# Patient Record
Sex: Female | Born: 1975 | Race: White | Hispanic: Yes | Marital: Married | Smoking: Never smoker
Health system: Southern US, Community
[De-identification: ages and names within clinical notes are randomized; demographics above are authoritative.]

---

## 2008-01-19 ENCOUNTER — Emergency Department (HOSPITAL_COMMUNITY): Admission: EM | Admit: 2008-01-19 | Discharge: 2008-01-19 | Payer: Self-pay | Admitting: Emergency Medicine

## 2008-01-26 ENCOUNTER — Emergency Department (HOSPITAL_COMMUNITY): Admission: EM | Admit: 2008-01-26 | Discharge: 2008-01-26 | Payer: Self-pay | Admitting: Emergency Medicine

## 2011-07-12 LAB — CBC
HCT: 38.3
Hemoglobin: 13.2
MCHC: 34.4
MCV: 89
RDW: 13.1

## 2011-07-12 LAB — COMPREHENSIVE METABOLIC PANEL
Alkaline Phosphatase: 45
BUN: 9
CO2: 22
Calcium: 9.3
GFR calc non Af Amer: >60
Glucose, Bld: 120 — ABNORMAL HIGH
Total Protein: 6.6

## 2011-07-12 LAB — POCT I-STAT, CHEM 8
Calcium, Ion: 1.17
Chloride: 105
HCT: 39
Hemoglobin: 13.3
Potassium: 3.3 — ABNORMAL LOW

## 2011-07-12 LAB — PROTIME-INR
INR: 1.1
Prothrombin Time: 14.7

## 2016-01-01 ENCOUNTER — Encounter (HOSPITAL_COMMUNITY): Payer: Self-pay | Admitting: Emergency Medicine

## 2016-01-01 ENCOUNTER — Emergency Department (HOSPITAL_COMMUNITY): Payer: Self-pay

## 2016-01-01 ENCOUNTER — Emergency Department (HOSPITAL_COMMUNITY)
Admission: EM | Admit: 2016-01-01 | Discharge: 2016-01-01 | Disposition: A | Discharge status: Home / Self Care | Payer: Self-pay | Attending: Emergency Medicine | Admitting: Emergency Medicine

## 2016-01-01 DIAGNOSIS — H9209 Otalgia, unspecified ear: Secondary | ICD-10-CM | POA: Insufficient documentation

## 2016-01-01 DIAGNOSIS — R51 Headache: Secondary | ICD-10-CM | POA: Insufficient documentation

## 2016-01-01 DIAGNOSIS — J320 Chronic maxillary sinusitis: Secondary | ICD-10-CM | POA: Insufficient documentation

## 2016-01-01 DIAGNOSIS — R519 Headache, unspecified: Secondary | ICD-10-CM

## 2016-01-01 DIAGNOSIS — J321 Chronic frontal sinusitis: Secondary | ICD-10-CM | POA: Insufficient documentation

## 2016-01-01 DIAGNOSIS — H53149 Visual discomfort, unspecified: Secondary | ICD-10-CM | POA: Insufficient documentation

## 2016-01-01 MED ORDER — PROCHLORPERAZINE EDISYLATE 5 MG/ML IJ SOLN
5.0000 mg | Freq: Once | INTRAMUSCULAR | Status: AC
  Administered 2016-01-01: 5 mg via INTRAVENOUS
  Filled 2016-01-01: qty 2

## 2016-01-01 MED ORDER — OXYCODONE-ACETAMINOPHEN 5-325 MG PO TABS
2.0000 | ORAL_TABLET | Freq: Once | ORAL | Status: AC
  Administered 2016-01-01: 1 via ORAL
  Filled 2016-01-01: qty 2

## 2016-01-01 MED ORDER — PROPARACAINE HCL 0.5 % OP SOLN
1.0000 [drp] | Freq: Once | OPHTHALMIC | Status: AC
  Administered 2016-01-01: 1 [drp] via OPHTHALMIC
  Filled 2016-01-01: qty 15

## 2016-01-01 MED ORDER — ONDANSETRON 4 MG PO TBDP
4.0000 mg | ORAL_TABLET | Freq: Once | ORAL | Status: AC
  Administered 2016-01-01: 4 mg via ORAL
  Filled 2016-01-01: qty 1

## 2016-01-01 MED ORDER — KETOROLAC TROMETHAMINE 30 MG/ML IJ SOLN
30.0000 mg | Freq: Once | INTRAMUSCULAR | Status: AC
  Administered 2016-01-01: 30 mg via INTRAVENOUS
  Filled 2016-01-01: qty 1

## 2016-01-01 MED ORDER — DIPHENHYDRAMINE HCL 50 MG/ML IJ SOLN
12.5000 mg | Freq: Once | INTRAMUSCULAR | Status: AC
Start: 2016-01-01 — End: 2016-01-01
  Administered 2016-01-01: 12.5 mg via INTRAVENOUS
  Filled 2016-01-01: qty 1

## 2016-01-01 MED ORDER — SODIUM CHLORIDE 0.9 % IV BOLUS (SEPSIS)
1000.0000 mL | Freq: Once | INTRAVENOUS | Status: AC
  Administered 2016-01-01: 1000 mL via INTRAVENOUS

## 2016-01-01 NOTE — Discharge Instructions (Signed)
General Headache Without Cause °A headache is pain or discomfort felt around the head or neck area. The specific cause of a headache may not be found. There are many causes and types of headaches. A few common ones are: °· Tension headaches. °· Migraine headaches. °· Cluster headaches. °· Chronic daily headaches. °HOME CARE INSTRUCTIONS  °Watch your condition for any changes. Take these steps to help with your condition: °Managing Pain °· Take over-the-counter and prescription medicines only as told by your health care provider. °· Lie down in a dark, quiet room when you have a headache. °· If directed, apply ice to the head and neck area: °· Put ice in a plastic bag. °· Place a towel between your skin and the bag. °· Leave the ice on for 20 minutes, 2-3 times per day. °· Use a heating pad or hot shower to apply heat to the head and neck area as told by your health care provider. °· Keep lights dim if bright lights bother you or make your headaches worse. °Eating and Drinking °· Eat meals on a regular schedule. °· Limit alcohol use. °· Decrease the amount of caffeine you drink, or stop drinking caffeine. °General Instructions °· Keep all follow-up visits as told by your health care provider. This is important. °· Keep a headache journal to help find out what may trigger your headaches. For example, write down: °· What you eat and drink. °· How much sleep you get. °· Any change to your diet or medicines. °· Try massage or other relaxation techniques. °· Limit stress. °· Sit up straight, and do not tense your muscles. °· Do not use tobacco products, including cigarettes, chewing tobacco, or e-cigarettes. If you need help quitting, ask your health care provider. °· Exercise regularly as told by your health care provider. °· Sleep on a regular schedule. Get 7-9 hours of sleep, or the amount recommended by your health care provider. °SEEK MEDICAL CARE IF:  °· Your symptoms are not helped by medicine. °· You have a  headache that is different from the usual headache. °· You have nausea or you vomit. °· You have a fever. °SEEK IMMEDIATE MEDICAL CARE IF:  °· Your headache becomes severe. °· You have repeated vomiting. °· You have a stiff neck. °· You have a loss of vision. °· You have problems with speech. °· You have pain in the eye or ear. °· You have muscular weakness or loss of muscle control. °· You lose your balance or have trouble walking. °· You feel faint or pass out. °· You have confusion. °  °This information is not intended to replace advice given to you by your health care provider. Make sure you discuss any questions you have with your health care provider. °  °Document Released: 10/03/2005 Document Revised: 06/24/2015 Document Reviewed: 01/26/2015 °Elsevier Interactive Patient Education ©2016 [employer] ° ° °Migraine Headache °A migraine headache is an intense, throbbing pain on one or both sides of your head. A migraine can last for 30 minutes to several hours. °CAUSES  °The exact cause of a migraine headache is not always known. However, a migraine may be caused when nerves in the brain become irritated and release chemicals that cause inflammation. This causes pain. °Certain things may also trigger migraines, such as: °· Alcohol. °· Smoking. °· Stress. °· Menstruation. °· Aged cheeses. °· Foods or drinks that contain nitrates, glutamate, aspartame, or tyramine. °· Lack of sleep. °· Chocolate. °· Caffeine. °· Hunger. °· Physical exertion. °·   Fatigue.  Medicines used to treat chest pain (nitroglycerine), birth control pills, estrogen, and some blood pressure medicines. SIGNS AND SYMPTOMS  Pain on one or both sides of your head.  Pulsating or throbbing pain.  Severe pain that prevents daily activities.  Pain that is aggravated by any physical activity.  Nausea, vomiting, or both.  Dizziness.  Pain with exposure to bright lights, loud noises, or activity.  General sensitivity to bright lights,  loud noises, or smells. Before you get a migraine, you may get warning signs that a migraine is coming (aura). An aura may include:  Seeing flashing lights.  Seeing bright spots, halos, or zigzag lines.  Having tunnel vision or blurred vision.  Having feelings of numbness or tingling.  Having trouble talking.  Having muscle weakness. DIAGNOSIS  A migraine headache is often diagnosed based on:  Symptoms.  Physical exam.  A CT scan or MRI of your head. These imaging tests cannot diagnose migraines, but they can help rule out other causes of headaches. TREATMENT Medicines may be given for pain and nausea. Medicines can also be given to help prevent recurrent migraines.  HOME CARE INSTRUCTIONS  Only take over-the-counter or prescription medicines for pain or discomfort as directed by your health care provider. The use of long-term narcotics is not recommended.  Lie down in a dark, quiet room when you have a migraine.  Keep a journal to find out what may trigger your migraine headaches. For example, write down:  What you eat and drink.  How much sleep you get.  Any change to your diet or medicines.  Limit alcohol consumption.  Quit smoking if you smoke.  Get 7-9 hours of sleep, or as recommended by your health care provider.  Limit stress.  Keep lights dim if bright lights bother you and make your migraines worse. SEEK IMMEDIATE MEDICAL CARE IF:   Your migraine becomes severe.  You have a fever.  You have a stiff neck.  You have vision loss.  You have muscular weakness or loss of muscle control.  You start losing your balance or have trouble walking.  You feel faint or pass out.  You have severe symptoms that are different from your first symptoms. MAKE SURE YOU:   Understand these instructions.  Will watch your condition.  Will get help right away if you are not doing well or get worse.   This information is not intended to replace advice given to  you by your health care provider. Make sure you discuss any questions you have with your health care provider.   Document Released: 10/03/2005 Document Revised: 10/24/2014 Document Reviewed: 06/10/2013 Elsevier Interactive Patient Education [employer]2016 [employer] Inc[employer]

## 2016-01-01 NOTE — ED Notes (Signed)
C/O RIGHT FACIAL PAIN AND DIZZINESS X 3 DAYS.

## 2016-01-01 NOTE — ED Notes (Signed)
Patient able to ambulate independently  

## 2016-01-01 NOTE — ED Provider Notes (Signed)
CSN: [id number]     Arrival date & time 01/01/16  1131 History  By signing my name below, I, Essence Howell, attest that this documentation has been prepared under the direction and in the presence of Arthor CaptainAbigail Ruston Fedora, PA-C Electronically Signed: Charline BillsEssence Howell, ED Scribe 01/01/2016 at 2:29 PM.   Chief Complaint  Patient presents with  . Facial Pain   The history is provided by the patient. No language interpreter was used.   HPI Comments: Ana Sims is a 40 y.o. female who presents to the Emergency Department complaining of constant right-sided HA for the past 3 days. Pt describes HA as 10/10, throbbing sensation that radiates into her right ear and right eye. She states that HA is so severe that it wakes her from her sleep. She reports associated photophobia and blurred vision only in the right eye. No treatments tried PTA. Pt denies fever. No personal h/o migraines but states that her mother has a h/o migraines.   History reviewed. No pertinent past medical history. History reviewed. No pertinent past surgical history. No family history on file. Social History  Substance Use Topics  . Smoking status: Never Smoker   . Smokeless tobacco: None  . Alcohol Use: No   OB History    No data available     Review of Systems  Constitutional: Negative for fever.  HENT: Positive for ear pain.   Eyes: Positive for photophobia and visual disturbance.  Neurological: Positive for headaches.   Allergies  Review of patient's allergies indicates no known allergies.  Home Medications   Prior to Admission medications   Not on File   BP 108/64 mmHg  Pulse 63  Temp(Src) 98 F (36.7 C) (Oral)  Resp 15  SpO2 100%  LMP 12/28/2015 Physical Exam  Constitutional: She is oriented to person, place, and time. She appears well-developed and well-nourished. No distress.  HENT:  Head: Normocephalic and atraumatic.  Right Ear: Tympanic membrane normal.  Left Ear: Tympanic membrane normal.  Nose:  Right sinus exhibits maxillary sinus tenderness and frontal sinus tenderness.  Mouth/Throat: Oropharynx is clear and moist. Normal dentition.  Eyes: Conjunctivae and EOM are normal. Pupils are equal, round, and reactive to light. No scleral icterus. Right eye exhibits no nystagmus.  R eye is subtly more proptotic than the L with pain with extraocular eye movement and subtle disconjugate movement. Pupils are equal.  Pressure  OD: 16 OS:20  No fluorescein uptake  Neck: Normal range of motion. Neck supple. No tracheal deviation present.  Full active and passive ROM without pain No midline or paraspinal tenderness No nuchal rigidity or meningeal signs  Cardiovascular: Normal rate, regular rhythm and intact distal pulses.   Pulmonary/Chest: Effort normal and breath sounds normal. No respiratory distress. She has no wheezes. She has no rales.  Abdominal: Soft. Bowel sounds are normal. There is no tenderness. There is no rebound and no guarding.  Musculoskeletal: Normal range of motion.  Lymphadenopathy:    She has no cervical adenopathy.  Neurological: She is alert and oriented to person, place, and time. She has normal reflexes. No cranial nerve deficit. She exhibits normal muscle tone. Coordination normal.  Mental Status:  Alert, oriented, thought content appropriate. Speech fluent without evidence of aphasia. Able to follow 2 step commands without difficulty.  Cranial Nerves:  II:  Peripheral visual fields grossly normal, pupils equal, round, reactive to light III,IV, VI: ptosis not present, extra-ocular motions intact bilaterally  V,VII: smile symmetric, facial light touch sensation equal VIII: hearing  grossly normal bilaterally  IX,X: midline uvula rise  XI: bilateral shoulder shrug equal and strong XII: midline tongue extension  Motor:  5/5 in upper and lower extremities bilaterally including strong and equal grip strength and dorsiflexion/plantar flexion Sensory: Pinprick and light  touch normal in all extremities.  Deep Tendon Reflexes: 2+ and symmetric  Cerebellar: normal finger-to-nose with bilateral upper extremities Gait: normal gait and balance CV: distal pulses palpable throughout   Skin: Skin is warm and dry. No rash noted. She is not diaphoretic.  Psychiatric: She has a normal mood and affect. Her behavior is normal. Judgment and thought content normal.  Nursing note and vitals reviewed.  ED Course  Procedures (including critical care time) DIAGNOSTIC STUDIES: Oxygen Saturation is 100% on RA, normal by my interpretation.    COORDINATION OF CARE: 1:05 PM-Discussed treatment plan which includes Percocet, Zofran and CT head with pt at bedside and pt agreed to plan.   Labs Review Labs Reviewed - No data to display  Imaging Review Ct Head Wo Contrast  01/01/2016  CLINICAL DATA:  Three-day history of headache. Dizziness. Pain most severe behind right eye EXAM: CT HEAD WITHOUT CONTRAST TECHNIQUE: Contiguous axial images were obtained from the base of the skull through the vertex without intravenous contrast. COMPARISON:  January 19, 2008 FINDINGS: The ventricles are normal in size and configuration. There is no intracranial mass, hemorrhage, extra-axial fluid collection, or midline shift. The gray-white compartments are normal. No acute infarct is evident. The bony calvarium appears intact. The mastoid air cells are clear. Visualized orbits appear symmetric bilaterally. IMPRESSION: Study within normal limits. Electronically Signed   By: Bretta Bang III M.D.   On: 01/01/2016 14:20   I have personally reviewed and evaluated these images and lab results as part of my medical decision-making.   EKG Interpretation None      MDM   Final diagnoses:  Bad headache    Patient CT negative Normal eye pressure  No signs of dental infection or sinus infection. Suspect migraine HA. Will treat with cocktail.  Pt HA treated and improved while in ED.  Presentation is  not concerning for [facility], ICH, Meningitis, or temporal arteritis. Pt is afebrile with no focal neuro deficits, nuchal rigidity, or change in vision. Pt is to follow up with PCP to discuss prophylactic medication. Pt verbalizes understanding and is agreeable with plan to dc.    I personally performed the services described in this documentation, which was scribed in my presence. The recorded information has been reviewed and is accurate.       Arthor Captain, PA-C 01/01/16 1628  Glynn Octave, MD 01/01/16 [phone]

## 2017-06-07 IMAGING — CT CT HEAD W/O CM
2 series · 16 of 30 positions shown, 20 images · non-contrast
Comparison: January 19, 2008

CLINICAL DATA: Three-day history of headache. Dizziness. Pain most
severe behind right eye

EXAM:
CT HEAD WITHOUT CONTRAST
TECHNIQUE: Contiguous axial images were obtained from the base of the skull
through the vertex without intravenous contrast.

[Series 201: head w/o, idose (1) · axial · non-contrast · 0.36mm/px · z∈[+60,+175]mm · 13 of 27 slices shown, 17 images]
[im 2/27  brain]
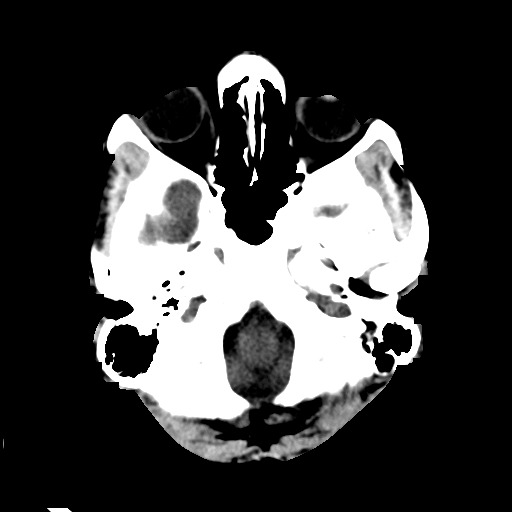
[im 2/27  bone]
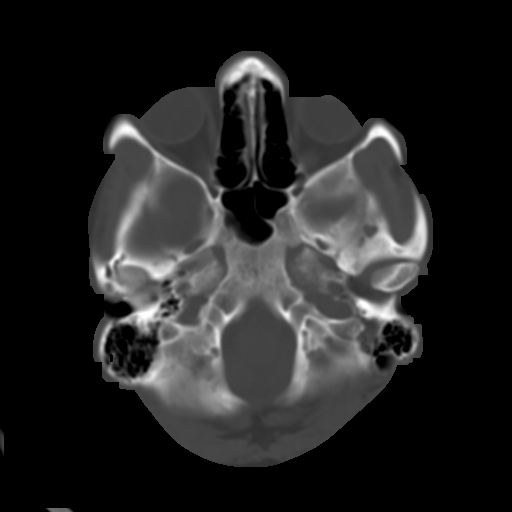
[im 4/27  brain]
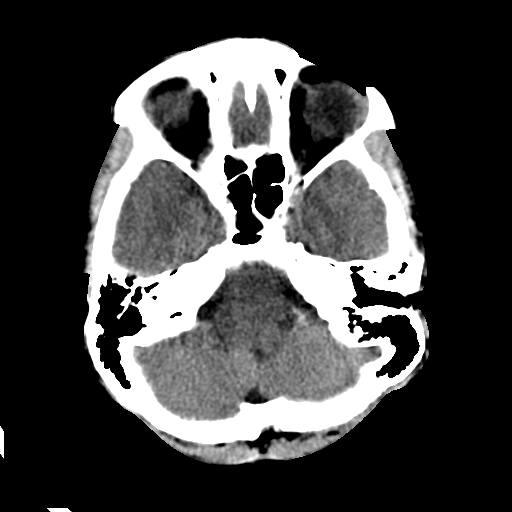
[im 6/27  brain]
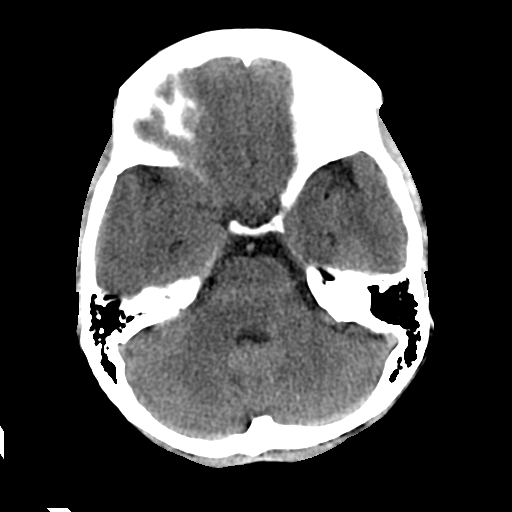
[im 8/27  brain]
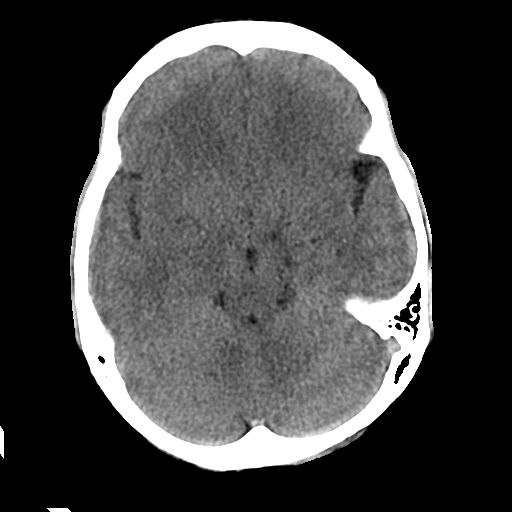
[im 10/27  brain]
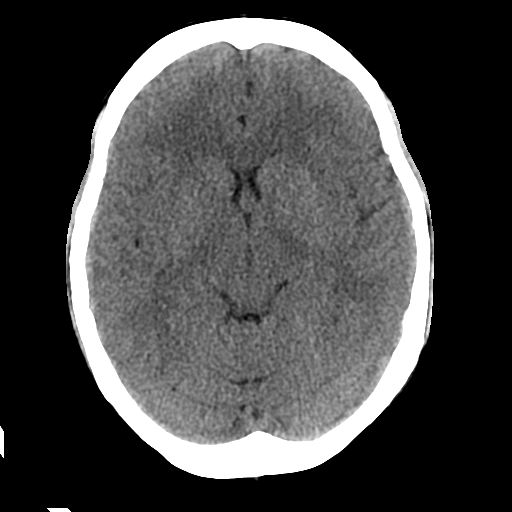
[im 10/27  bone]
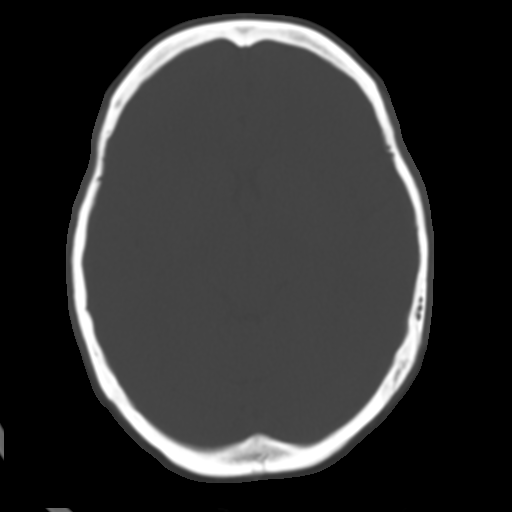
[im 12/27  brain]
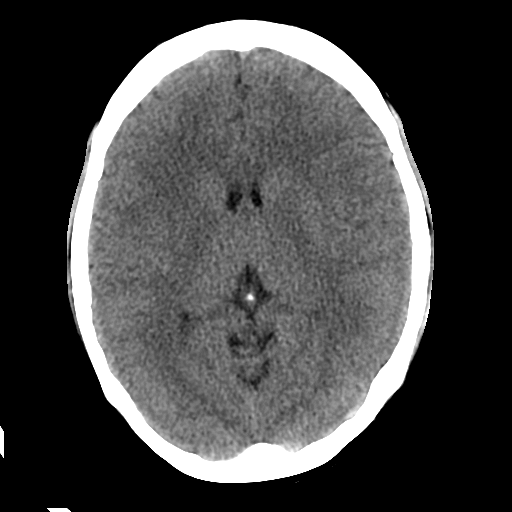
[im 14/27  brain]
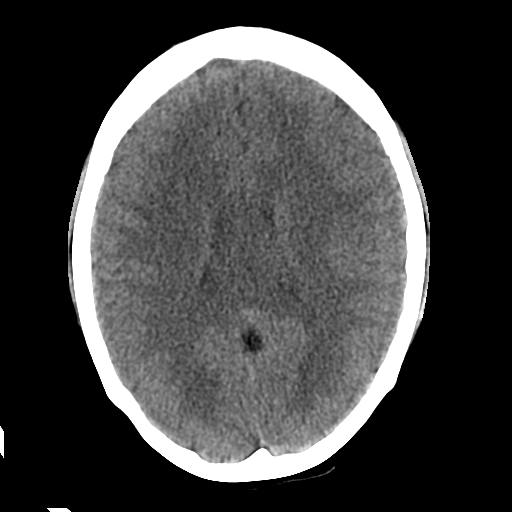
[im 15/27  brain]
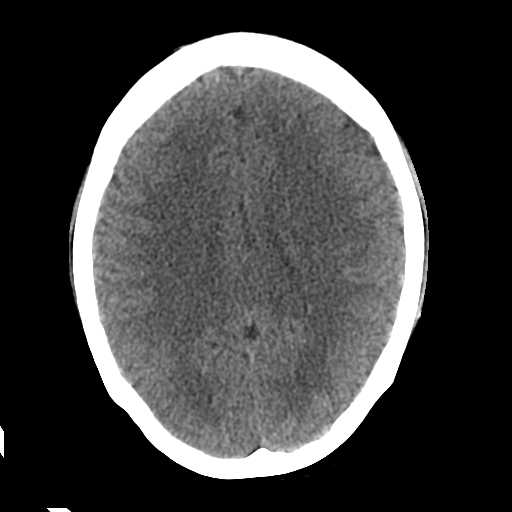
[im 17/27  brain]
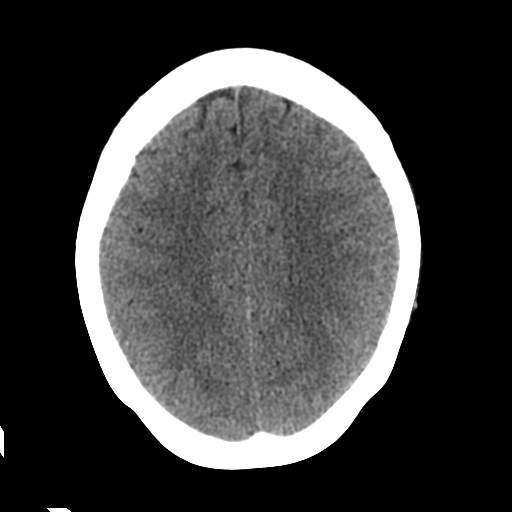
[im 17/27  bone]
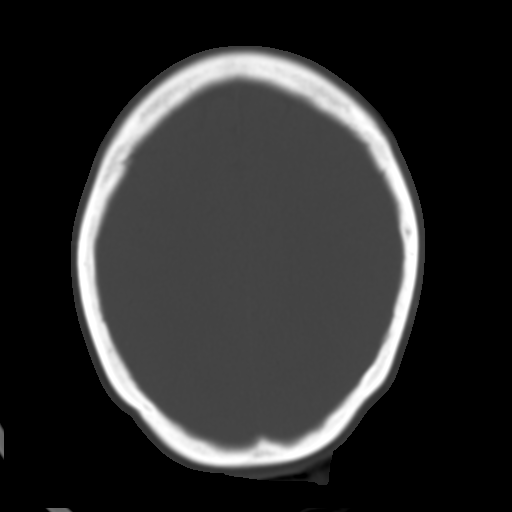
[im 19/27  brain]
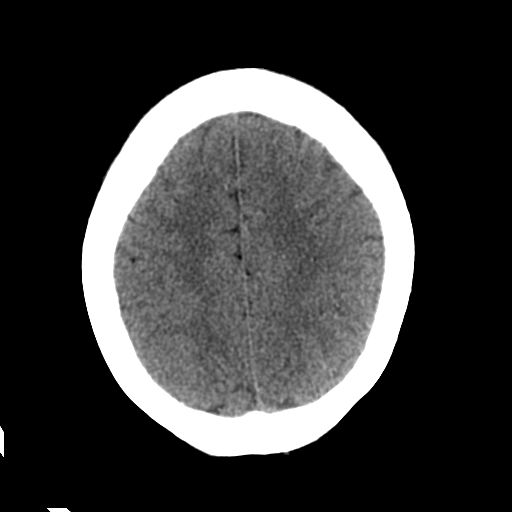
[im 21/27  brain]
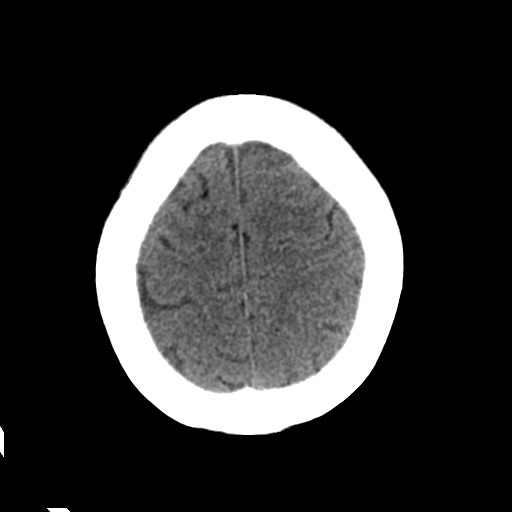
[im 23/27  brain]
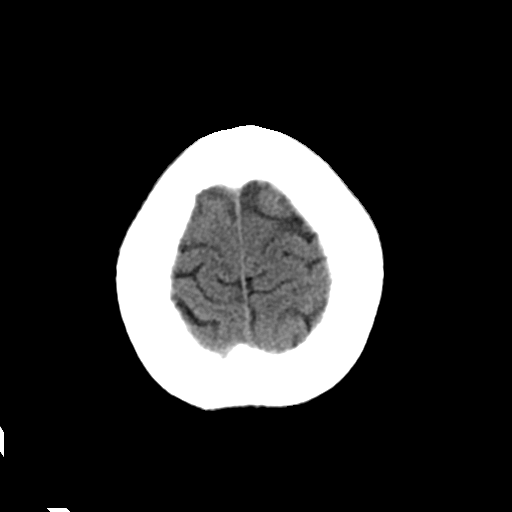
[im 25/27  brain]
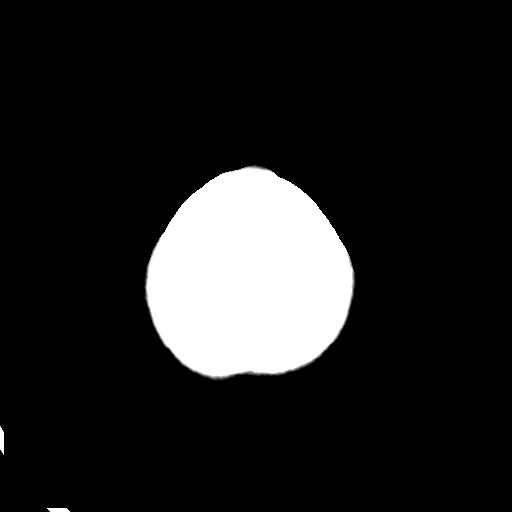
[im 25/27  bone]
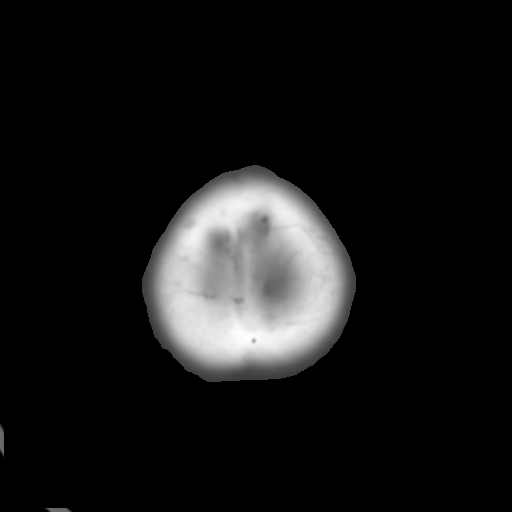

[Series 202: head w/o bone, idose (1) · axial · non-contrast · 0.36mm/px · z∈[+60,+100]mm · 3 of 27 slices shown]
[im 2/27  bone]
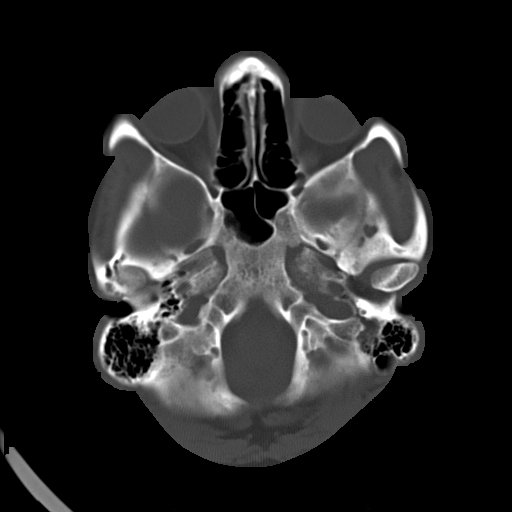
[im 6/27  bone]
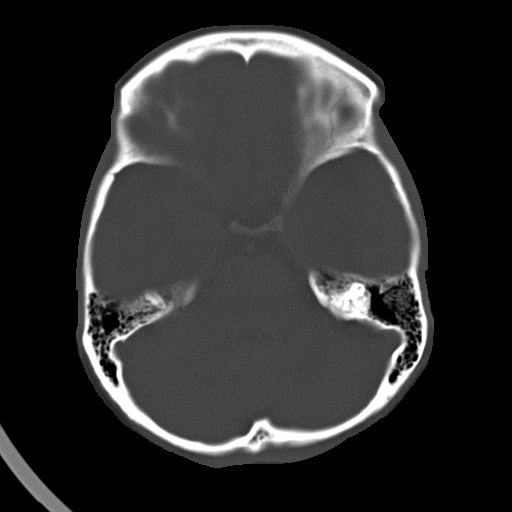
[im 10/27  bone]
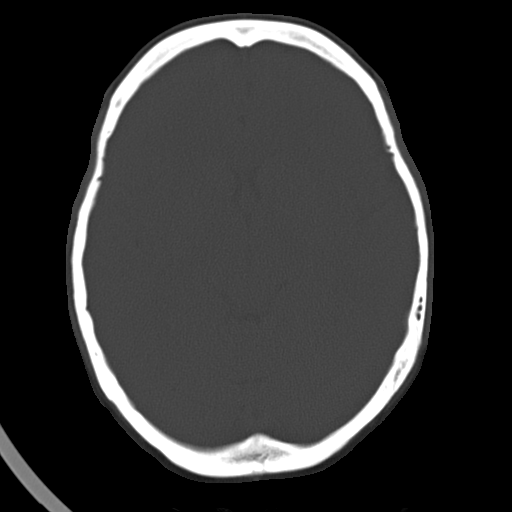

[16 of 30 positions shown; findings below may reference images not displayed]

FINDINGS: The ventricles are normal in size and configuration. There is no
intracranial mass, hemorrhage, extra-axial fluid collection, or
midline shift. The gray-white compartments are normal. No acute
infarct is evident. The bony calvarium appears intact. The mastoid
air cells are clear. Visualized orbits appear symmetric bilaterally.
IMPRESSION: Study within normal limits.
